# Patient Record
Sex: Female | Born: 1960 | Race: Black or African American | Hispanic: No | Smoking: Never smoker
Health system: Southern US, Community
[De-identification: ages and names within clinical notes are randomized; demographics above are authoritative.]

## PROBLEM LIST (undated history)

## (undated) DIAGNOSIS — G479 Sleep disorder, unspecified: Secondary | ICD-10-CM

## (undated) DIAGNOSIS — R002 Palpitations: Secondary | ICD-10-CM

## (undated) HISTORY — PX: PARTIAL HYSTERECTOMY: SHX80

## (undated) HISTORY — DX: Palpitations: R00.2

## (undated) HISTORY — DX: Sleep disorder, unspecified: G47.9

---

## 1999-07-13 HISTORY — PX: TUBAL LIGATION: SHX77

## 2003-02-05 ENCOUNTER — Inpatient Hospital Stay (HOSPITAL_COMMUNITY): Admission: RE | Admit: 2003-02-05 | Discharge: 2003-02-07 | Payer: Self-pay | Admitting: Obstetrics & Gynecology

## 2007-06-23 ENCOUNTER — Ambulatory Visit (HOSPITAL_COMMUNITY): Admission: RE | Admit: 2007-06-23 | Discharge: 2007-06-23 | Payer: Self-pay | Admitting: Obstetrics & Gynecology

## 2009-01-10 IMAGING — US US PELVIS COMPLETE MODIFY
1 series · 14 of 19 positions shown · non-contrast
Comparison: none

CLINICAL DATA: Left lower quadrant pain and pelvic pain.
 TRANSABDOMINAL AND TRANSVAGINAL PELVIC ULTRASOUND ? 06/23/07:
TECHNIQUE: Both transabdominal and transvaginal ultrasound examinations of the pelvis were performed including evaluation of the uterus, ovaries, adnexal regions, and pelvic cul-de-sac.

[Series 1: unknown · 0.32mm/px · 14 of 19 slices shown]
[im 1/19]
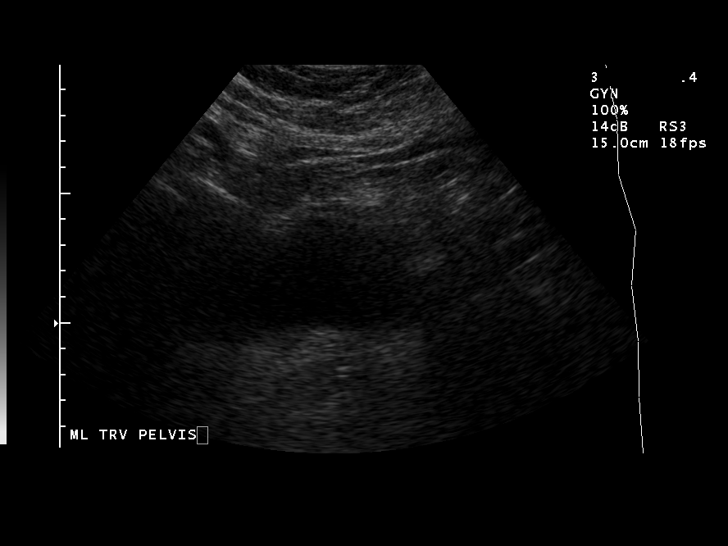
[im 3/19]
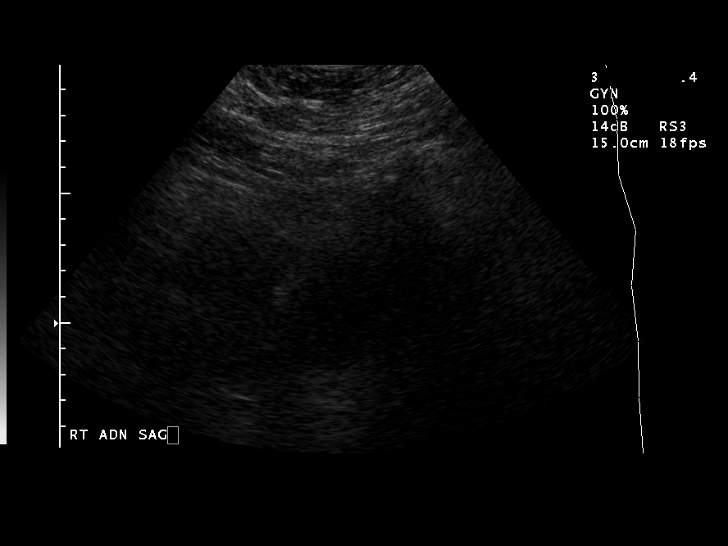
[im 4/19]
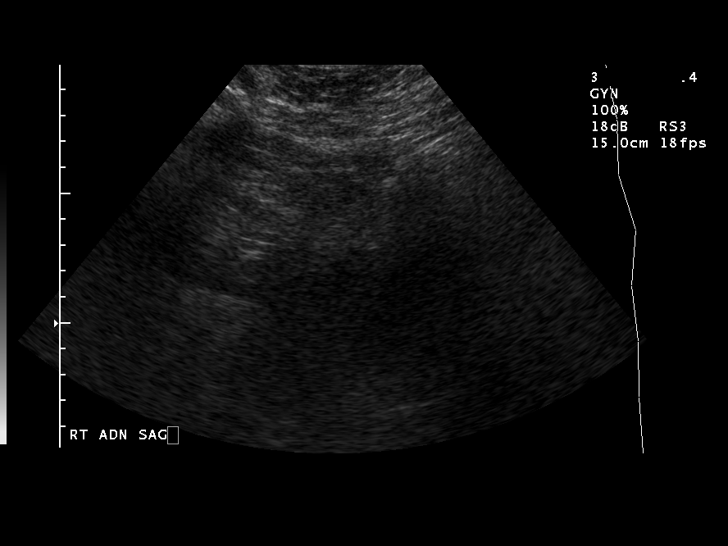
[im 5/19]
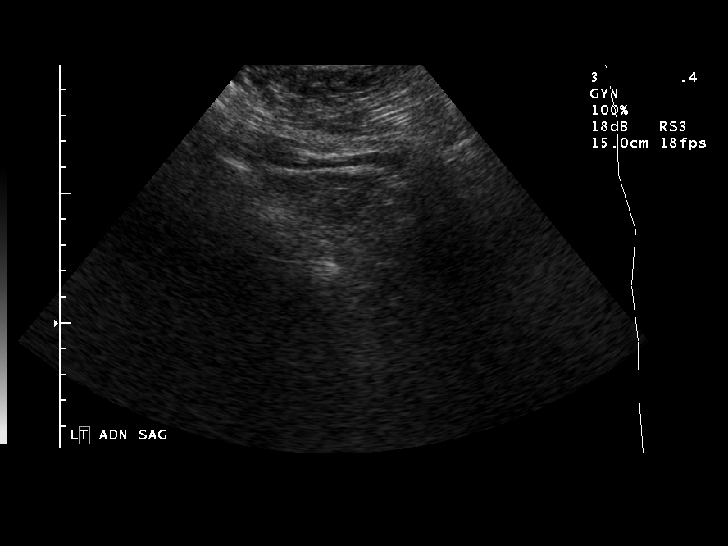
[im 7/19]
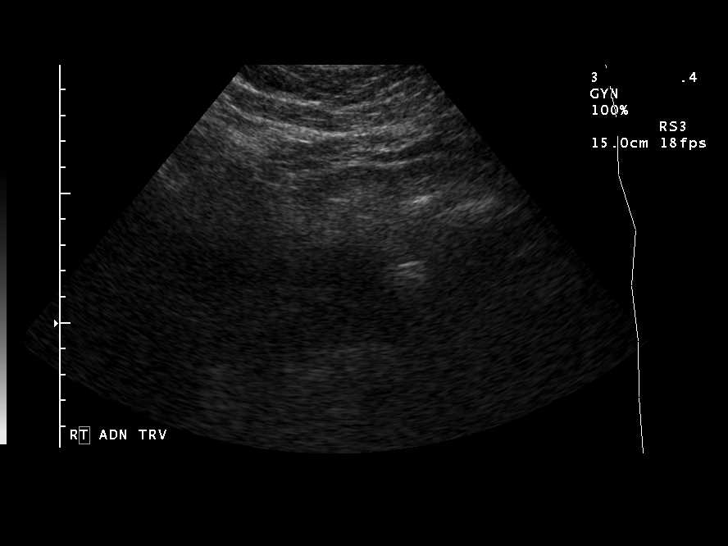
[im 8/19]
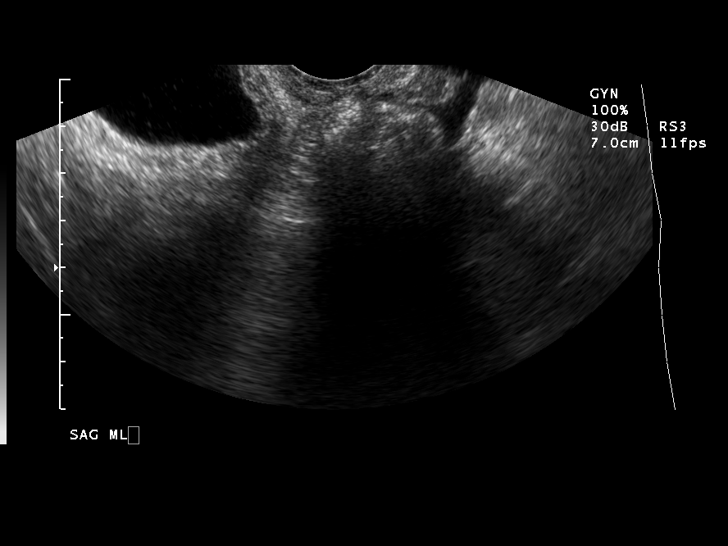
[im 9/19]
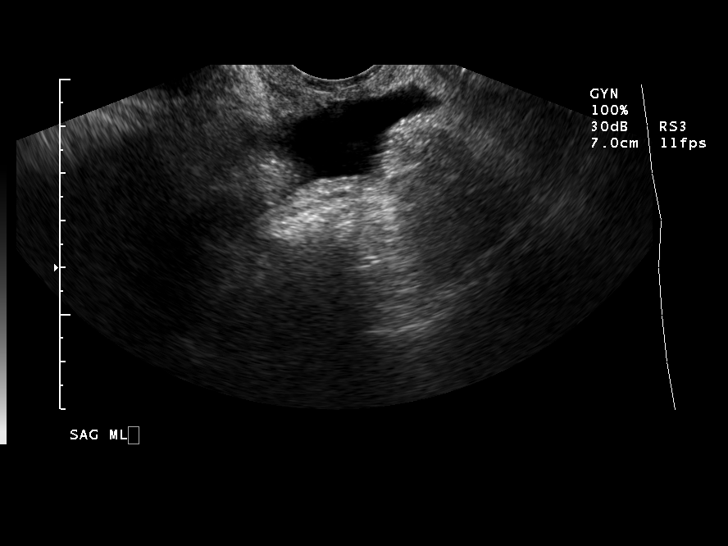
[im 11/19]
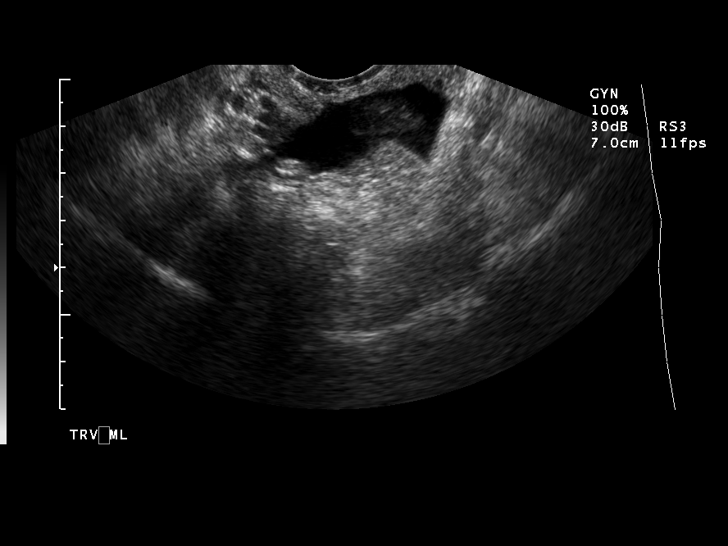
[im 12/19]
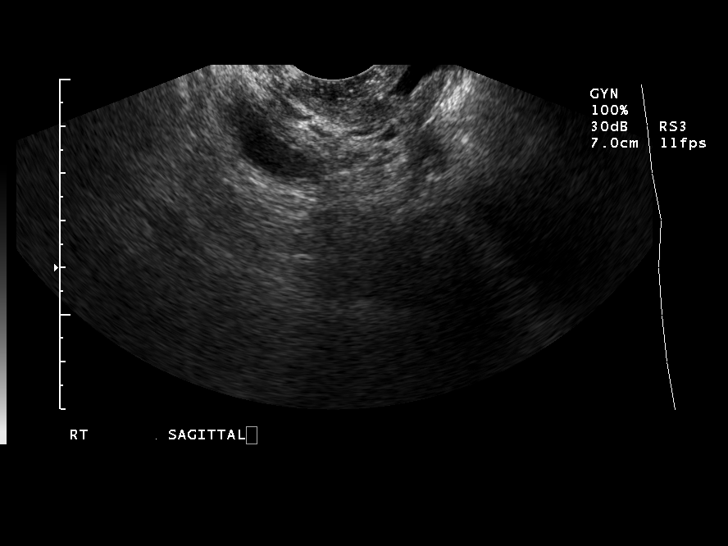
[im 13/19]
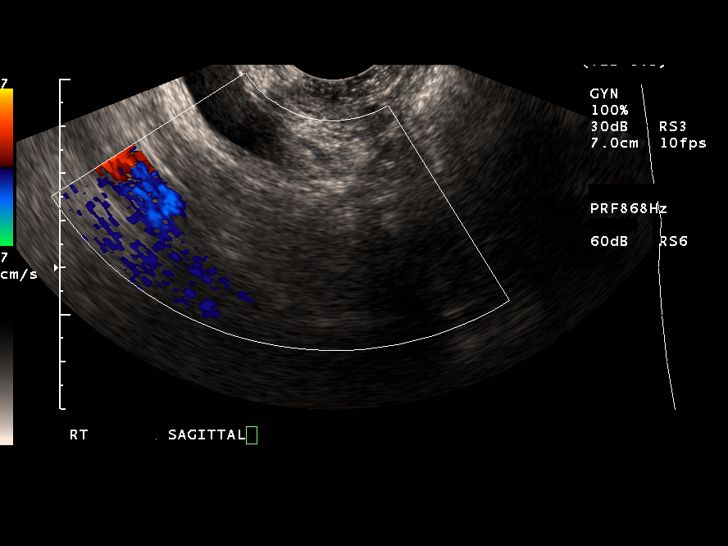
[im 15/19]
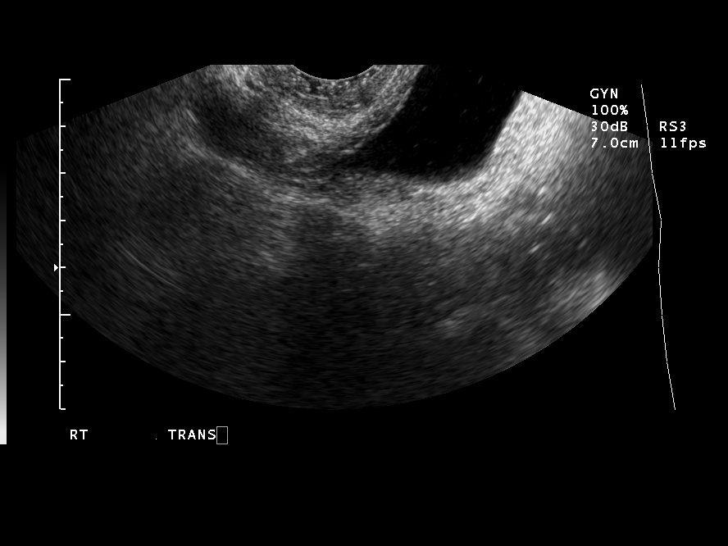
[im 16/19]
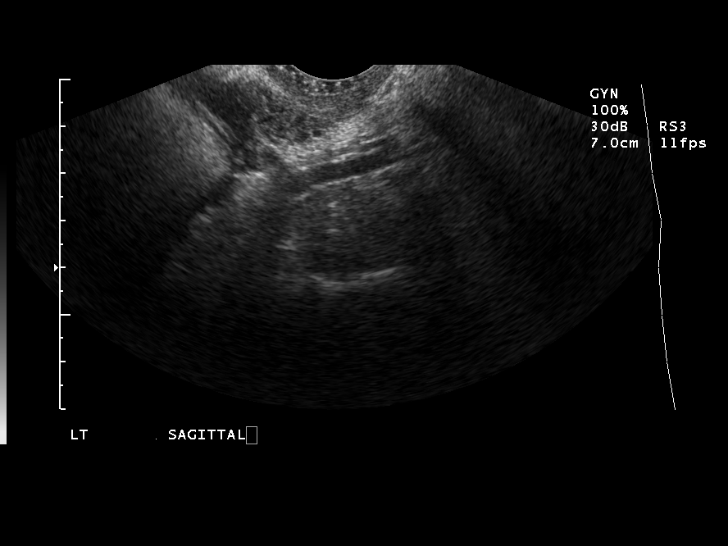
[im 17/19]
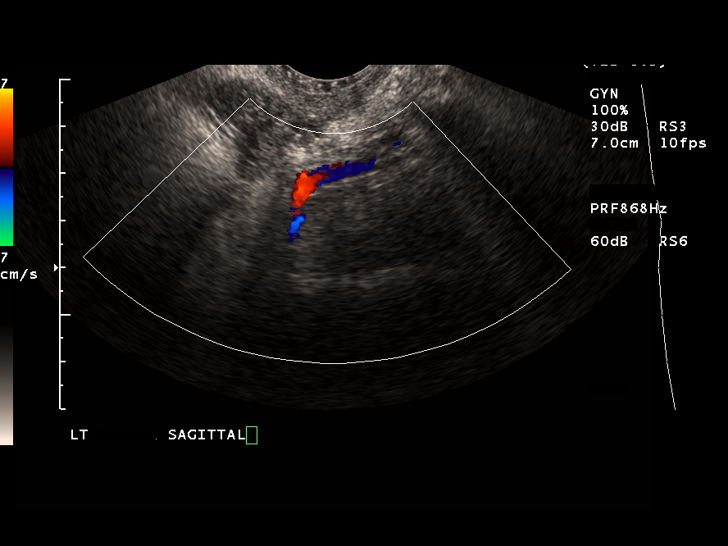
[im 19/19]
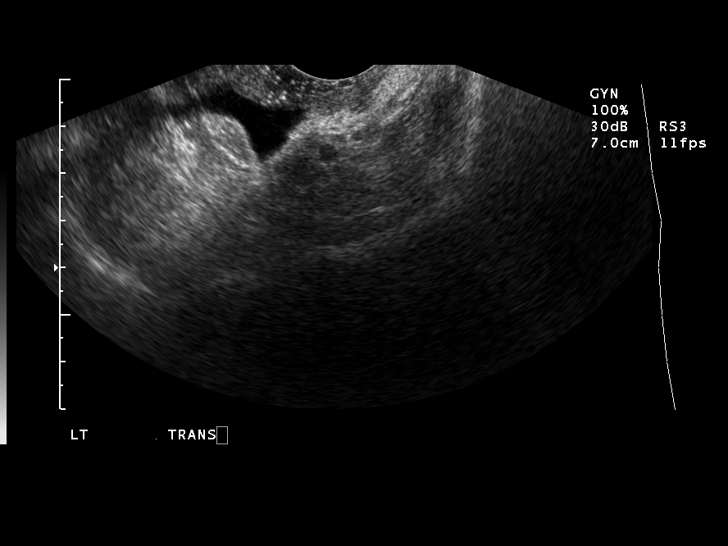

[14 of 19 positions shown; findings below may reference images not displayed]

FINDINGS: The patient has undergone prior hysterectomy.  Neither ovary is visualized due to body habitus.  No fluid is seen within the pelvis.
IMPRESSION: Prior hysterectomy.  No pelvic mass.  Neither ovary is visualized.

## 2009-05-08 ENCOUNTER — Emergency Department (HOSPITAL_COMMUNITY): Admission: EM | Admit: 2009-05-08 | Discharge: 2009-05-08 | Payer: Self-pay | Admitting: Emergency Medicine

## 2009-05-21 ENCOUNTER — Ambulatory Visit: Payer: Self-pay | Admitting: Internal Medicine

## 2009-05-21 DIAGNOSIS — R072 Precordial pain: Secondary | ICD-10-CM | POA: Insufficient documentation

## 2009-05-21 DIAGNOSIS — Z91013 Allergy to seafood: Secondary | ICD-10-CM

## 2009-05-21 DIAGNOSIS — R002 Palpitations: Secondary | ICD-10-CM | POA: Insufficient documentation

## 2009-05-21 DIAGNOSIS — I10 Essential (primary) hypertension: Secondary | ICD-10-CM

## 2009-05-30 ENCOUNTER — Telehealth: Payer: Self-pay | Admitting: Internal Medicine

## 2009-06-11 ENCOUNTER — Telehealth (INDEPENDENT_AMBULATORY_CARE_PROVIDER_SITE_OTHER): Payer: Self-pay | Admitting: *Deleted

## 2009-06-12 ENCOUNTER — Ambulatory Visit: Payer: Self-pay

## 2009-06-12 ENCOUNTER — Encounter: Payer: Self-pay | Admitting: Internal Medicine

## 2009-06-12 ENCOUNTER — Ambulatory Visit: Payer: Self-pay | Admitting: Internal Medicine

## 2009-06-17 ENCOUNTER — Ambulatory Visit: Payer: Self-pay

## 2009-06-17 ENCOUNTER — Encounter (HOSPITAL_COMMUNITY): Admission: RE | Admit: 2009-06-17 | Discharge: 2009-07-10 | Payer: Self-pay | Admitting: Internal Medicine

## 2009-06-26 ENCOUNTER — Encounter: Payer: Self-pay | Admitting: Internal Medicine

## 2009-06-27 ENCOUNTER — Ambulatory Visit: Payer: Self-pay | Admitting: Internal Medicine

## 2009-11-17 ENCOUNTER — Ambulatory Visit (HOSPITAL_COMMUNITY): Admission: RE | Admit: 2009-11-17 | Discharge: 2009-11-17 | Payer: Self-pay | Admitting: Obstetrics & Gynecology

## 2010-10-15 LAB — BASIC METABOLIC PANEL
BUN: 10 mg/dL (ref 6–23)
Chloride: 107 mEq/L (ref 96–112)
Glucose, Bld: 107 mg/dL — ABNORMAL HIGH (ref 70–99)
Potassium: 3.9 mEq/L (ref 3.5–5.1)

## 2010-10-15 LAB — CBC
Platelets: 360 10*3/uL (ref 150–400)
RBC: 3.92 MIL/uL (ref 3.87–5.11)
RDW: 13.5 % (ref 11.5–15.5)

## 2010-11-27 NOTE — H&P (Signed)
   NAME:  Diane Kline, Diane Kline                    ACCOUNT NO.:  [id number]   MEDICAL RECORD NO.:  [id number]                   PATIENT TYPE:  AMB   LOCATION:  DAY                                  FACILITY:  [facility]   PHYSICIAN:  Lazaro Arms, M.D.                DATE OF BIRTH:  09/01/1960   DATE OF ADMISSION:  DATE OF DISCHARGE:                                HISTORY & PHYSICAL   HISTORY OF PRESENT ILLNESS:  [name] is a 50 year old African-American  female, gravida 2, para 2, who is status post C-section in the past, who has  been known to have fibroid uterus since that time.  She has been seen  multiple times regarding her fibroids and they have steadily grown.  Additionally she has become more symptomatic with increasing period pain and  increasing heaviness of her periods, increasing clots, and also painful  intercourse.  As a result she has decided to go from conservative management  to definitive management with a TAH.   PAST MEDICAL HISTORY:  Otherwise negative.   PAST SURGICAL HISTORY:  She had a tubal ligation in 2001 at the time of her  C-section.  She had Norplant in and back out again.   PAST OB HISTORY:  As above.   FAMILY HISTORY:  Hypertension, diabetes, and cancer.   PHYSICAL EXAMINATION:  VITAL SIGNS:  Her weight is 236 pounds, blood  pressure is 120/80.  HEENT:  Unremarkable.  NECK:  Thyroid is normal.  LUNGS:  Clear.  HEART:  Regular rate and rhythm without regurge or gallop.  BREASTS:  Deferred.  ABDOMEN:  Benign.  No hepatosplenomegaly or masses.  PELVIC:  Reveals enlarged uterus, 14-16 weeks' size.  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  My impression is to represent degeneration of  myomas from rapid growth.  The adnexa is negative.  EXTREMITIES:  No edema.  NEUROLOGIC:  Grossly intact.   IMPRESSION:  1. Enlarged fibroid uterus.  2. Increasing problems with pain and menometrorrhagia.  3. Dyspareunia.    PLAN:  The patient is admitted  for abdominal hysterectomy.  She understands  the risks, benefits, indications, and alternatives.  Will proceed.  She was  given handouts regarding hysterectomy and fibroids.                                               Lazaro Arms, M.D.    Diane Kline  D:  02/04/2003  T:  02/04/2003  Job:  [id number]

## 2010-11-27 NOTE — Discharge Summary (Signed)
   NAME:  Diane Kline, Diane Kline                    ACCOUNT NO.:  [id number]   MEDICAL RECORD NO.:  [id number]                   PATIENT TYPE:  INP   LOCATION:  A425                                 FACILITY:  [facility]   PHYSICIAN:  Lazaro Arms, M.D.                DATE OF BIRTH:  January 17, 1961   DATE OF ADMISSION:  02/05/2003  DATE OF DISCHARGE:  02/07/2003                                 DISCHARGE SUMMARY   DISCHARGE DIAGNOSES:  1. Status post abdominal hysterectomy.  2. Unremarkable postoperative course.   PROCEDURE:  Abdominal hysterectomy.   HISTORY OF PRESENT ILLNESS:  Please refer to the transcribed history and  physical and the operative report for details of the admission to the  hospital.   HOSPITAL COURSE:  The patient was admitted after her abdominal hysterectomy,  which went without difficulty.  Her postoperative course was quite  unremarkable.  She tolerated clear liquids and a regular diet.  She voided  without symptoms.  She was ambulatory.  Her hemoglobin and hematocrit on  postoperative day #1 were 11.2 and 32.4.  She tolerated transition from  intravenous pain medication to oral pain medicine and voided without  difficulty.  She was afebrile with stable vital signs.  She was discharged  to home on the morning of postoperative day #2 in good, stable condition.   FOLLOW UP:  The patient is to follow up in the office the following week to  have her staples removed and Steri-Strips placed. She was given precautions  for return prior to that time.   DISCHARGE MEDICATIONS:  Patient was given Tylox and Motrin as discharge  prescriptions.                                                Lazaro Arms, M.D.    [name]  D:  03/26/2003  T:  03/26/2003  Job:  [id number]

## 2010-11-27 NOTE — Op Note (Signed)
NAME:  Diane Kline, Diane Kline                    ACCOUNT NO.:  [id number]   MEDICAL RECORD NO.:  [id number]                   PATIENT TYPE:  AMB   LOCATION:  DAY                                  FACILITY:  [facility]   PHYSICIAN:  Lazaro Arms, M.D.                DATE OF BIRTH:  12/21/1960   DATE OF PROCEDURE:  02/05/2003  DATE OF DISCHARGE:                                 OPERATIVE REPORT   PREOPERATIVE DIAGNOSES:  1. Enlarged fibroid uterus, 14 to 16-week size.  2. Increasing dysmenorrhea.  3. Menometrorrhagia.   POSTOPERATIVE DIAGNOSES:  1. Enlarged fibroid uterus, 14 to 16-week size.  2. Increasing dysmenorrhea.  3. Menometrorrhagia.   PROCEDURE:  Abdominal hysterectomy.   SURGEON:  Lazaro Arms, M.D.   ANESTHESIA:  General endotracheal.   FINDINGS:  The patient had an enlarged fibroid uterus.  Multiple myomas were  identified, some subserosal and some pedunculated.  The ovaries were  otherwise normal.  There were no other intraperitoneal abnormalities.   DESCRIPTION OF PROCEDURE:  The patient was taken to the operating room and  placed in the supine position where she underwent general endotracheal  anesthesia.  The vagina was then prepped.  A Foley catheter was placed.  She  was then prepped and draped in the usual sterile fashion.   A Pfannenstiel skin incision was made and carried down sharply to the rectus  fascia.  The peritoneal cavity was entered in the usual fashion.  A Balfour  self-retaining retractor was placed, and the upper abdomen was packed away.  A bladder blade was placed.  The uterine cornu were grasped.  The left round  ligament was suture ligated and cut.  The vesicouterine serosal flap on the  left was created.  The utero-ovarian ligament on the left was clamped, cut,  and double suture ligated.  The right round ligament was suture ligated and  cut.  The utero-ovarian ligament on the right was clamped, cut, and double  suture ligated.  The  vesicouterine serosal flap on the right was created.  The bladder was pushed off of the lower uterine segment.  The uterine  vessels were clamped, cut, and suture ligated bilaterally.  Serial pedicles  were taken down the cervix through the cardinal ligament, each pedicle being  clamped, cut, and transfixion suture ligated.  Serial pedicles were taken  encountered at the end of the cervix and crossclamped the vagina, and the  specimen was removed.  Vaginal angle sutures were placed.  The vagina was  closed with interrupted figure-of-eight sutures.  The pelvis was irrigated  vigorously with warm normal saline, and all pedicles were found to be  hemostatic.  Intercede was placed over the vaginal cuff to prevent  postoperative adhesions and prevent the small bowel and ovaries from  sticking to the top of the cuff.  The packs were removed.  The self-  retaining retractor was removed.  The muscles and  peritoneum were closed  with interrupted suture loosely.  The fascia was closed using 0 Vicryl  running.  The subcutaneous tissues were made hemostatic and irrigated.  The  skin was closed using skin staples.   The patient tolerated the procedure well.  She experienced 200 cc of blood  loss and was taken to the recovery room in good and stable condition.  All  counts were correct x3.                                               Lazaro Arms, M.D.    [name]  D:  02/05/2003  T:  02/05/2003  Job:  [id number]

## 2014-05-14 ENCOUNTER — Ambulatory Visit (INDEPENDENT_AMBULATORY_CARE_PROVIDER_SITE_OTHER): Payer: Medicaid Other | Admitting: Orthopedic Surgery

## 2014-05-14 ENCOUNTER — Encounter: Payer: Self-pay | Admitting: Orthopedic Surgery

## 2014-05-14 VITALS — BP 122/78 | Ht 64.0 in | Wt 261.0 lb

## 2014-05-14 DIAGNOSIS — S6991XA Unspecified injury of right wrist, hand and finger(s), initial encounter: Secondary | ICD-10-CM

## 2014-05-14 DIAGNOSIS — S6990XA Unspecified injury of unspecified wrist, hand and finger(s), initial encounter: Secondary | ICD-10-CM | POA: Insufficient documentation

## 2014-05-14 NOTE — Progress Notes (Signed)
Chief Complaint  Patient presents with  . Hand Injury    Right thumb fracture, DOI 04/05/14   Patient ID: Diane Kline, female   DOB: 04-08-1961, 53 y.o.   MRN: [id number]  Chief Complaint  Patient presents with  . Hand Injury    Right thumb fracture, DOI 04/05/14    HPI Diane Kline is a 53 y.o. female. HPI  The patient resents with a injury to her right thumb crushed in a car door back in September on the 25th she was treated with splinting and follow-up here for evaluation and treatment  She complains of some dull pain and mild nailbed deformity. She was treated with nabumetone 750 as well. She has full function of the thumb at this point with a slight deformity of the nail but an intact nail with some subungual hematoma.  Review of systems she reports sinus problems and night sweats everything else was negative. She has no allergies she has a medical history of no problems she did have a cesarean section and a partial hysterectomy she takes vitamins  Her thumb has full flexion and extension with no weakness tendon dysfunction or motion loss. There is no instability at the DIP joint or the IP joint. No strength deficits in the flexor extensor tendon skin is intact the nail has some subungual hematoma has good capillary refill in the finger normal sensation    No past medical history on file.  No past surgical history on file.  No family history on file.  Social History History  Substance Use Topics  . Smoking status: Unknown If Ever Smoked  . Smokeless tobacco: Not on file  . Alcohol Use: 0.0 oz/week    0 Not specified per week    Allergies not on file  No current outpatient prescriptions on file.   No current facility-administered medications for this visit.    Review of Systems Review of Systems  Blood pressure 122/78, height 5\' 4"  (1.626 m), weight 261 lb (118.389 kg).  Physical Exam Physical Exam  Data Reviewed X-ray in addition shows a  slight avulsion fracture near the IP joint, nothing displaced  Assessment    Crush injury right thumb     Plan    Activity as tolerated        Fuller CanadaStanley Harrison 05/14/2014, 11:15 AM

## 2014-07-25 ENCOUNTER — Telehealth: Payer: Self-pay | Admitting: Cardiology

## 2014-07-25 NOTE — Telephone Encounter (Signed)
[facility]Dr Lilly CoveNimish Gosrani) for appointment on 07/26/14 with Dr Antoine PocheHochrein.  [facility] Dr Hochrein's schedule on 07/26/14. lp

## 2014-07-26 ENCOUNTER — Encounter: Payer: Self-pay | Admitting: Cardiology

## 2014-07-26 ENCOUNTER — Ambulatory Visit (INDEPENDENT_AMBULATORY_CARE_PROVIDER_SITE_OTHER): Payer: Medicaid Other | Admitting: Cardiology

## 2014-07-26 VITALS — BP 114/72 | Ht 64.0 in | Wt 266.6 lb

## 2014-07-26 DIAGNOSIS — R002 Palpitations: Secondary | ICD-10-CM

## 2014-07-26 DIAGNOSIS — I10 Essential (primary) hypertension: Secondary | ICD-10-CM

## 2014-07-26 DIAGNOSIS — R0683 Snoring: Secondary | ICD-10-CM

## 2014-07-26 DIAGNOSIS — G4730 Sleep apnea, unspecified: Secondary | ICD-10-CM

## 2014-07-26 NOTE — Progress Notes (Signed)
   HPI The patient presents for evaluation of palpitations and fatigue she has a history of palpitations and she was seen years ago by Dr. Graciela HusbandsKlein.  However, these symptoms are worse than they had been.  She feels palpitations every day.  She feels her heart racing.  This seems to be more with stress.  She has it more at the end of the month when the bills are due.  She describes a rapid rate.  She does not have syncope or presyncope.  She has severe fatigue.  The patient denies any new symptoms such as chest discomfort, neck or arm discomfort. There has been no new shortness of breath, PND or orthopnea. There have been no reported palpitations, presyncope or syncope.  She is very stressed raising a 52 year old son as a single parent.   Allergies  Allergen Reactions  . Shellfish Allergy     No current outpatient prescriptions on file.   No current facility-administered medications for this visit.    Past Medical History  Diagnosis Date  . Sleep disorder     breathing  . Hypertension   . Palpitations     Past Surgical History  Procedure Laterality Date  . Tubal ligation  2001  . Cesarean section    . Partial hysterectomy      No family history on file.  History   Social History  . Marital Status: Divorced    Spouse Name: N/A    Number of Children: N/A  . Years of Education: N/A   Occupational History  . Not on file.   Social History Main Topics  . Smoking status: Unknown If Ever Smoked  . Smokeless tobacco: Not on file  . Alcohol Use: 0.0 oz/week    0 Not specified per week  . Drug Use: No  . Sexual Activity: Not on file   Other Topics Concern  . Not on file   Social History Narrative    ROS:  As stated in the HPI and negative for all other systems.   PHYSICAL EXAM BP 114/72 mmHg  Ht 5\' 4"  (1.626 m)  Wt 266 lb 9.6 oz (120.929 kg)  BMI 45.74 kg/m2  GENERAL:  Well appearing HEENT:  Pupils equal round and reactive, fundi not visualized, oral mucosa  unremarkable NECK:  No jugular venous distention, waveform within normal limits, carotid upstroke brisk and symmetric, no bruits, no thyromegaly LYMPHATICS:  No cervical, inguinal adenopathy LUNGS:  Clear to auscultation bilaterally BACK:  No CVA tenderness CHEST:  Unremarkable HEART:  PMI not displaced or sustained,S1 and S2 within normal limits, no S3, no S4, no clicks, no rubs, no murmurs ABD:  Flat, positive bowel sounds normal in frequency in pitch, no bruits, no rebound, no guarding, no midline pulsatile mass, no hepatomegaly, no splenomegaly EXT:  2 plus pulses throughout, no edema, no cyanosis no clubbing SKIN:  No rashes no nodules NEURO:  Cranial nerves II through XII grossly intact, motor grossly intact throughout PSYCH:  Cognitively intact, oriented to person place and time, tearful   EKG:   NSR, rate 93, axis WNL, intervals WNL, no acute ST T wave changes.   07/26/2014  ASSESSMENT AND PLAN  SNORING/FATIGUE:  I will schedule her for a sleep study.  She reports that she has had routine lab testing which I will assume includes a TSH and CBC.  PALPITATIONS:  I will order a 48 hour Holter.  OVERWEIGHT:  We talked about strategies for this.

## 2014-07-26 NOTE — Patient Instructions (Signed)
Your physician recommends that you schedule a follow-up appointment in: 1 Month  Your physician has recommended that you have a sleep study. This test records several body functions during sleep, including: brain activity, eye movement, oxygen and carbon dioxide blood levels, heart rate and rhythm, breathing rate and rhythm, the flow of air through your mouth and nose, snoring, body muscle movements, and chest and belly movement.  Your physician has recommended that you wear a holter monitor. Holter monitors are medical devices that record the heart's electrical activity. Doctors most often use these monitors to diagnose arrhythmias. Arrhythmias are problems with the speed or rhythm of the heartbeat. The monitor is a small, portable device. You can wear one while you do your normal daily activities. This is usually used to diagnose what is causing palpitations/syncope (passing out).

## 2014-08-14 ENCOUNTER — Ambulatory Visit (HOSPITAL_BASED_OUTPATIENT_CLINIC_OR_DEPARTMENT_OTHER): Payer: Medicaid Other | Attending: Cardiology | Admitting: Radiology

## 2014-08-14 VITALS — Ht 64.0 in | Wt 262.0 lb

## 2014-08-14 DIAGNOSIS — G4730 Sleep apnea, unspecified: Secondary | ICD-10-CM | POA: Diagnosis present

## 2014-08-14 DIAGNOSIS — R0683 Snoring: Secondary | ICD-10-CM | POA: Insufficient documentation

## 2014-08-14 DIAGNOSIS — R002 Palpitations: Secondary | ICD-10-CM

## 2014-08-18 NOTE — Sleep Study (Signed)
   NAME: Diane Kline DATE OF BIRTH:  [birth year] MEDICAL RECORD NUMBER [id number]  LOCATION: [facility]  PHYSICIAN: Tieler Cournoyer A  DATE OF STUDY: 08/14/2014  SLEEP STUDY TYPE: Nocturnal Polysomnogram               REFERRING PHYSICIAN: Rollene RotundaHochrein, James, MD  INDICATION FOR STUDY:  Diane Kline is a 54 year old female who has a history of morbid obesity, hypertension, palpitations, who is referred for evaluation of sleep apnea.  She has a history of significant snoring, nonrestorative sleep, and daytime sleepiness.  EPWORTH SLEEPINESS SCORE:  13 which is elevated and compatible with excessive daytime sleepiness. HEIGHT: 5\' 4"  (162.6 cm)  WEIGHT: 262 lb (118.842 kg)    Body mass index is 44.95 kg/(m^2).  NECK SIZE: 16 in.  MEDICATIONS:  She is not on any current medications.  SLEEP ARCHITECTURE:  The patient slept for 368 minutes out of a sleep period of time of 383.5 minutes.  Sleep efficiency was excellent at 93.3%.  Latency to sleep onset was normal at 10.5 minutes.  Latency to REM sleep was 105.5 minutes.  The patient slept for 7.5 minutes in stage I (2%), 292.5 minutes in stage II (79.5%), and 68 minutes in rem sleep (18.5%).  She spent 116.5 minutes (31.7%) in supine sleep, of which 0.5 minutes (5.0%) was supine REM sleep.  There was evidence for mild-moderate snoring.  The arousal index was increased at 19.4.  RESPIRATORY DATA:  During the sleep period of time, the patient had 100 obstructive apneas, 0 central apneas, 1 mixed apnea, and 141 hypopneas.  The apnea hypopnea index  (AHI) was 39.5/hr and the respiratory disturbance index (RDI) was 42.2/hr. The AHI in REM sleep was 74/hr.  This places the patient in the category of severe obstructive sleep apnea.  Severe sleep apnea was demonstrated both in supine as well as in nonsupine sleep.  OXYGEN DATA:  The baseline oxygen saturation was 98%.  The lowest oxygen saturation during non-REM sleep  was 72% and during REM sleep was 68%.  CARDIAC DATA:  The baseline heart rate was 91 bpm which was sinus rhythm.  There were rare to occasional PVCs and PACs.  MOVEMENT/PARASOMNIA:  There were 0 periodic limb movements.  IMPRESSION/ RECOMMENDATION:   Severe obstructive sleep apnea/hypoxia syndrome.  Events were more severe in REM sleep.  Respiratory events with frequent oxygen desaturation to a nadir of 72% with non-REM sleep and 68% with REM sleep. Mild to moderate snoring. Abnormal sleep architecture with reduction in slow wave sleep. No evidence for nocturnal myoclonus. The arousal index was abnormal.  In this patient with severe obstructive sleep apnea, recommend expeditious CPAP titration. Effort should be made to optimize nasal and oral pharyngeal patency. The patient should be counseled in both good sleep hygiene as well as weight loss, particularly with body mass index of 45.    Lennette BihariKELLY,Nephtali Docken A Diplomate, American Board of Sleep Medicine  ELECTRONICALLY SIGNED ON:  08/18/2014, 12:07 PM [facility] PH: [phone]   FX: [phone] ACCREDITED BY THE AMERICAN ACADEMY OF SLEEP MEDICINE

## 2014-08-18 NOTE — Addendum Note (Signed)
Addended by: Nicki GuadalajaraKELLY, Alvena Kiernan A on: 08/18/2014 12:26 PM   Modules accepted: Level of Service

## 2014-08-23 ENCOUNTER — Telehealth: Payer: Self-pay | Admitting: *Deleted

## 2014-08-23 ENCOUNTER — Telehealth: Payer: Self-pay | Admitting: Cardiology

## 2014-08-23 DIAGNOSIS — G4733 Obstructive sleep apnea (adult) (pediatric): Secondary | ICD-10-CM

## 2014-08-23 NOTE — Telephone Encounter (Signed)
Pt is returning Wanda's call in reference to her sleep apnea test last week.  thanks

## 2014-08-23 NOTE — Telephone Encounter (Signed)
Returned a call to patient to inform her of sleep study results and recommendations. CPAP titration study ordered.

## 2014-08-23 NOTE — Telephone Encounter (Signed)
Left message to return a call.( sleep study results) 

## 2014-08-29 ENCOUNTER — Ambulatory Visit (HOSPITAL_BASED_OUTPATIENT_CLINIC_OR_DEPARTMENT_OTHER): Payer: Medicaid Other | Attending: Cardiovascular Disease | Admitting: Radiology

## 2014-08-29 VITALS — Ht 64.0 in | Wt 264.0 lb

## 2014-08-29 DIAGNOSIS — G4733 Obstructive sleep apnea (adult) (pediatric): Secondary | ICD-10-CM | POA: Diagnosis present

## 2014-08-30 ENCOUNTER — Ambulatory Visit (INDEPENDENT_AMBULATORY_CARE_PROVIDER_SITE_OTHER): Payer: Medicaid Other | Admitting: Cardiology

## 2014-08-30 ENCOUNTER — Encounter: Payer: Self-pay | Admitting: Cardiology

## 2014-08-30 VITALS — BP 126/85 | HR 82 | Ht 64.0 in | Wt 266.2 lb

## 2014-08-30 DIAGNOSIS — R002 Palpitations: Secondary | ICD-10-CM

## 2014-08-30 NOTE — Progress Notes (Signed)
   HPI The patient presents for follow up of palpitations and fatigue.  She had severe sleep apnea on a study that she had earlier this month. Last night she came back for part 2 were the mask and this morning she feels much better. I did have her wear an event monitor. This showed sinus rhythm with sinus arrhythmia. There was rare ventricular ectopy and no sustained dysrhythmias. I reviewed this with her today. She does have palpitations and some discomfort associated with this. However, this always happens with anxiety she has a lot of stress. It happens after she has an anxious dream. She is not describing classic substernal chest pressure, neck or arm discomfort..  She has severe fatigue.  here has been no new shortness of breath, PND or orthopnea. There have been no reported palpitations, presyncope or syncope.   Allergies  Allergen Reactions  . Shellfish Allergy     No current outpatient prescriptions on file.   No current facility-administered medications for this visit.    Past Medical History  Diagnosis Date  . Sleep disorder     She thinks she has apnea but no testing  . Palpitations     Past Surgical History  Procedure Laterality Date  . Tubal ligation  2001  . Cesarean section    . Partial hysterectomy      ROS:  As stated in the HPI and negative for all other systems.   PHYSICAL EXAM BP 126/85 mmHg  Pulse 82  Ht 5\' 4"  (1.626 m)  Wt 266 lb 3.2 oz (120.748 kg)  BMI 45.67 kg/m2  GENERAL:  Well appearing HEENT:  Pupils equal round and reactive, fundi not visualized, oral mucosa unremarkable NECK:  No jugular venous distention, waveform within normal limits, carotid upstroke brisk and symmetric, no bruits, no thyromegaly LYMPHATICS:  No cervical, inguinal adenopathy LUNGS:  Clear to auscultation bilaterally BACK:  No CVA tenderness CHEST:  Unremarkable HEART:  PMI not displaced or sustained,S1 and S2 within normal limits, no S3, no S4, no clicks, no rubs, no  murmurs ABD:  Flat, positive bowel sounds normal in frequency in pitch, no bruits, no rebound, no guarding, no midline pulsatile mass, no hepatomegaly, no splenomegaly EXT:  2 plus pulses throughout, no edema, no cyanosis no clubbing NEURO:  Cranial nerves II through XII grossly intact, motor grossly intact throughout PSYCH:  Cognitively intact, oriented to person place and time.     EKG:   NSR, rate 82, axis WNL, intervals WNL, no acute ST T wave changes.   08/30/2014  ASSESSMENT AND PLAN  SNORING/FATIGUE:  She felt much better last night wearing the mask. I discussed this with Dr. Tresa EndoKelly and we will expedite follow-up in a prescription for this. Further plans based on response to this.  PALPITATIONS:  She had no significant dysrhythmias on her monitor. I suspect this is related to her severe anxiety. No change in therapy is indicated.  OVERWEIGHT:  We talked about strategies for this.

## 2014-08-30 NOTE — Patient Instructions (Signed)
Your physician recommends that you schedule a follow-up appointment with Dr Tresa EndoKelly when he has read your sleep study you will get a call

## 2014-09-05 ENCOUNTER — Telehealth: Payer: Self-pay | Admitting: Cardiovascular Disease

## 2014-09-05 NOTE — Telephone Encounter (Signed)
Pt would like her sleep study results from 08-29-14 please.

## 2014-09-06 NOTE — Telephone Encounter (Signed)
Patient has been told on multiple occasions that once her study has been read the referral to the medical equipment company will be done. She will be contacted once her benefits has been verified. Message forwarded to Dr. Tresa EndoKelly for review. The second night study hasn't formally been read.

## 2014-09-07 NOTE — Addendum Note (Signed)
Addended by: Nicki GuadalajaraKELLY, THOMAS A on: 09/07/2014 05:02 PM   Modules accepted: Level of Service

## 2014-09-07 NOTE — Telephone Encounter (Signed)
The CPAP titration study was read by me 2/27

## 2014-09-07 NOTE — Sleep Study (Signed)
   NAME: [name] DATE OF BIRTH:  25-Jan-1961 MEDICAL RECORD NUMBER [id number]  LOCATION: [facility]  PHYSICIAN: Brixon Zhen A  DATE OF STUDY: 08/29/2014  SLEEP STUDY TYPE: Positive Airway Pressure Titration               REFERRING PHYSICIAN: Lennette BihariKelly, Enolia Koepke A, MD, Angelina SheriffJake Hochrein, MD  INDICATION FOR STUDY:  Ms. Diane Kline is a 54 year old female who has a history of morbid obesity, hypertension, and palpitations.  On diagnostic polysomnogram .  She was found to have severe obstructive sleep apnea with an AHI of 39.5 per hour overall and during REM sleep 74 per hour.  She is referred for CPAP titration trial.  EPWORTH SLEEPINESS SCORE:  12, which is compatible with excessive daytime sleepiness. HEIGHT: 5\' 4"  (162.6 cm)  WEIGHT: 264 lb (119.75 kg)    Body mass index is 45.29 kg/(m^2).  NECK SIZE: 16 in.  MEDICATIONS:  No current medications.   SLEEP ARCHITECTURE:  The patient slept for 369 minutes out of a sleep period of time of 384.5 minutes, giving a sleep efficiency at 95.1%.  Sleep latency was 3.5 minutes.  She had normal latency to REM sleep at 81 minutes.  She slept 6.5 minutes in stage I (1.8%), 241.5 minutes in stage II (65.4%), 0 minutes in stage III, and 121 minutes (32.8%) in REM sleep.  She was able to sleep supine for 190 minutes (51.5% of which 78.5 minutes (21.3%) was in supine REM sleep.  There were a total of 12 arousals with an index of 2.0.  RESPIRATORY DATA:  CPAP was initiated at 5 cm water pressure and was titrated up to 9 cm water pressure.  AHI was 0 at 9 cm.  She was able to achieve supine and REM supine sleep was able to sleep for 35.5 minutes at this pressure.  OXYGEN DATA:  Baseline oxygen saturation was 98%.  The lows oxygen saturation with non-REM sleep was 93% and with REM sleep was 87%.  The lowest O2 saturation in REM sleep at 9 cm pressure was 94%.  CARDIAC DATA:  The patient was in sinus rhythm.  The average heart rate  was 84 bpm.  There was a rare isolated PVC.  MOVEMENT/PARASOMNIA:  There were 0 periodic limb movements.  IMPRESSION/ RECOMMENDATION:   The patient had an excellent response to CPAP therapy.  Recommend initial use of CPAP with C-Flex/EPR at 3 and 9 cm water pressure with heated humidifier and heated tubing.  A ResMed Airfit F 10 FFM, medium size was used during the titration.  Recommend a download load in 30 days and sleep clinic evaluation.    Lennette BihariKELLY,Daryel Kenneth A Diplomate, American Board of Sleep Medicine  ELECTRONICALLY SIGNED ON:  09/07/2014, 4:46 PM [facility] PH: [phone]   FX: [phone] ACCREDITED BY THE AMERICAN ACADEMY OF SLEEP MEDICINE

## 2014-09-09 NOTE — Telephone Encounter (Signed)
Patient referral done to [facility].

## 2014-09-10 ENCOUNTER — Encounter: Payer: Self-pay | Admitting: Cardiology

## 2014-09-12 ENCOUNTER — Encounter: Payer: Self-pay | Admitting: Cardiovascular Disease

## 2014-09-13 ENCOUNTER — Telehealth: Payer: Self-pay | Admitting: *Deleted

## 2014-09-13 NOTE — Telephone Encounter (Signed)
Faxed signed order for medicaid approval form back to [facility].

## 2014-09-15 ENCOUNTER — Encounter (HOSPITAL_BASED_OUTPATIENT_CLINIC_OR_DEPARTMENT_OTHER): Payer: Medicaid Other

## 2014-09-16 ENCOUNTER — Telehealth: Payer: Self-pay | Admitting: Cardiovascular Disease

## 2014-09-16 NOTE — Telephone Encounter (Signed)
Returning your call from last week. °

## 2014-10-04 ENCOUNTER — Encounter (HOSPITAL_BASED_OUTPATIENT_CLINIC_OR_DEPARTMENT_OTHER): Payer: Medicaid Other

## 2014-10-08 ENCOUNTER — Telehealth: Payer: Self-pay | Admitting: Cardiovascular Disease

## 2014-10-08 NOTE — Telephone Encounter (Signed)
Spoke with Jasmine DecemberSharon @ [facility]. [facility]. She will call patient and schedule a time for set up. She will also call Becky @ Dr. Michelle NasutiKnowlton's office to give update.

## 2014-10-08 NOTE — Telephone Encounter (Signed)
Dr. Sudie BaileyKnowlton would like for us to follow up with [facility] on the status of Mrs. Connors's CPAP machine.  He feels this has become a urgent matter.  Please let his office know when this has been addressed and also the outcome.

## 2014-10-18 ENCOUNTER — Encounter: Payer: Self-pay | Admitting: Cardiology

## 2014-10-18 ENCOUNTER — Ambulatory Visit (INDEPENDENT_AMBULATORY_CARE_PROVIDER_SITE_OTHER): Payer: Medicaid Other | Admitting: Cardiology

## 2014-10-18 VITALS — BP 118/72 | HR 85 | Ht 64.0 in | Wt 267.0 lb

## 2014-10-18 DIAGNOSIS — R072 Precordial pain: Secondary | ICD-10-CM

## 2014-10-18 DIAGNOSIS — I10 Essential (primary) hypertension: Secondary | ICD-10-CM

## 2014-10-18 NOTE — Patient Instructions (Addendum)
Your physician recommends that you schedule a follow-up appointment As Needed  Your physician has requested that you have an exercise tolerance test. For further information please visit [email or web address]. Please also follow instruction sheet, as given.  Your physician recommends that you schedule a follow-up appointment in: June with Dr Tresa EndoKelly for sleep study

## 2014-10-18 NOTE — Progress Notes (Signed)
   HPI The patient presents for follow up of palpitations and fatigue.  She had severe sleep apnea on a study that she had earlier this month and is having CPAP initiated.  She just started it and it might a helping a little.    Unfortunately she still continues to have complaints. She does have some left arm numbness when she gets upset. She feels the palpitations that she has described previously. She's not going to school now because she has too much stress she recognizes the anxiety. She's not having any new substernal chest pressure, neck discomfort. She's not having any new shortness of breath, PND or orthopnea. She says she still too fatigued to exercise.  Allergies  Allergen Reactions  . Shellfish Allergy     No current outpatient prescriptions on file.   No current facility-administered medications for this visit.    Past Medical History  Diagnosis Date  . Sleep disorder     She thinks she has apnea but no testing  . Palpitations     Past Surgical History  Procedure Laterality Date  . Tubal ligation  2001  . Cesarean section    . Partial hysterectomy      ROS:  As stated in the HPI and negative for all other systems.   PHYSICAL EXAM BP 118/72 mmHg  Pulse 85  Ht 5\' 4"  (1.626 m)  Wt 267 lb (121.11 kg)  BMI 45.81 kg/m2  GENERAL:  Well appearing NECK:  No jugular venous distention, waveform within normal limits, carotid upstroke brisk and symmetric, no bruits, no thyromegaly LUNGS:  Clear to auscultation bilaterally BACK:  No CVA tenderness CHEST:  Unremarkable HEART:  PMI not displaced or sustained,S1 and S2 within normal limits, no S3, no S4, no clicks, no rubs, no murmurs ABD:  Flat, positive bowel sounds normal in frequency in pitch, no bruits, no rebound, no guarding, no midline pulsatile mass, no hepatomegaly, no splenomegaly EXT:  2 plus pulses throughout, no edema, no cyanosis no clubbing   EKG:  Sinus rhythm, rate 85, axis within normal limits, intervals  within normal limits, no acute ST-T wave changes.  10/18/2014   ASSESSMENT AND PLAN  SNORING/FATIGUE:  She has sleep apnea and I will make sure she has follow-up with Dr. Tresa EndoKelly. She will continue to wear the mask.  PALPITATIONS:  She had no significant dysrhythmias on her monitor. I suspect this is related to her severe anxiety. No change in therapy is indicated.  ARM PAIN:  I have a very low pretest probability that she has obstructive coronary disease. I will bring the patient back for a POET (Plain Old Exercise Test). This will allow me to screen for obstructive coronary disease, risk stratify and very importantly provide a prescription for exercise.  ANXIETY:  I did discuss this with her at length. When she sees Milana ObeyKNOWLTON,STEPHEN D, MD next she will discuss this issue. By that time she should have had several weeks on C Pap to see how many of her symptoms resolved.

## 2014-10-25 ENCOUNTER — Other Ambulatory Visit: Payer: Self-pay | Admitting: Cardiology

## 2014-10-25 DIAGNOSIS — R072 Precordial pain: Secondary | ICD-10-CM

## 2014-10-25 DIAGNOSIS — I10 Essential (primary) hypertension: Secondary | ICD-10-CM

## 2014-11-15 ENCOUNTER — Telehealth (HOSPITAL_COMMUNITY): Payer: Self-pay

## 2014-11-15 NOTE — Telephone Encounter (Signed)
Encounter complete. 

## 2014-11-20 ENCOUNTER — Encounter (HOSPITAL_COMMUNITY): Payer: Medicaid Other

## 2014-11-20 ENCOUNTER — Ambulatory Visit (HOSPITAL_COMMUNITY)
Admission: RE | Admit: 2014-11-20 | Discharge: 2014-11-20 | Disposition: A | Discharge status: Home / Self Care | Payer: Medicaid Other | Source: Ambulatory Visit | Attending: Cardiology | Admitting: Cardiology

## 2014-11-20 DIAGNOSIS — R072 Precordial pain: Secondary | ICD-10-CM | POA: Diagnosis not present

## 2014-11-20 DIAGNOSIS — I10 Essential (primary) hypertension: Secondary | ICD-10-CM | POA: Insufficient documentation

## 2014-11-20 LAB — EXERCISE TOLERANCE TEST
CHL CUP MPHR: 169 {beats}/min
CSEPED: 4 min
CSEPEDS: 11 s
CSEPEW: 6 METS
CSEPPHR: 148 {beats}/min
Percent HR: 87 %
RPE: 21312
Rest HR: 82 {beats}/min

## 2014-12-02 ENCOUNTER — Encounter: Payer: Self-pay | Admitting: Cardiovascular Disease

## 2014-12-02 ENCOUNTER — Ambulatory Visit (INDEPENDENT_AMBULATORY_CARE_PROVIDER_SITE_OTHER): Payer: Medicaid Other | Admitting: Cardiovascular Disease

## 2014-12-02 VITALS — BP 132/87 | HR 102 | Ht 64.0 in | Wt 269.6 lb

## 2014-12-02 DIAGNOSIS — I10 Essential (primary) hypertension: Secondary | ICD-10-CM

## 2014-12-02 DIAGNOSIS — G4733 Obstructive sleep apnea (adult) (pediatric): Secondary | ICD-10-CM

## 2014-12-02 DIAGNOSIS — Z9989 Dependence on other enabling machines and devices: Principal | ICD-10-CM

## 2014-12-02 NOTE — Patient Instructions (Signed)
[facility] will be making adjustments to your machine. Your physician recommends that you schedule a follow-up appointment as needed with Dr. Tresa EndoKelly for sleep.

## 2014-12-04 ENCOUNTER — Encounter: Payer: Self-pay | Admitting: Cardiovascular Disease

## 2014-12-04 DIAGNOSIS — G4733 Obstructive sleep apnea (adult) (pediatric): Secondary | ICD-10-CM | POA: Insufficient documentation

## 2014-12-04 DIAGNOSIS — I10 Essential (primary) hypertension: Secondary | ICD-10-CM | POA: Insufficient documentation

## 2014-12-04 DIAGNOSIS — Z9989 Dependence on other enabling machines and devices: Principal | ICD-10-CM

## 2014-12-04 NOTE — Progress Notes (Signed)
Patient ID: Diane Kline, female   DOB: May 27, 1961, 54 y.o.   MRN: [id number]     HPI: Diane Kline, is a 54 y.o. female who is followed by Dr. Antoine Kline and was referred for a sleep study to evaluate for potential sleep apnea.  She now presents for sleep clinic evaluation following initiation of CPAP therapy.  Diane Kline is a 54 year old female who has a history of morbid obesity, hypertension, fatigability, and palpitations.  She has a history of significant snoring, nonrestorative sleep and daytime sleepiness.  She was referred for diagnostic polysomnogram which was done at [facility] on 08/14/2014.  This revealed severe obstructive sleep apnea with an HIF 39.5 per hour.  Her sleep apnea was very severe in REM sleep with an AHI of 74 per hour, as well as in the supine position.  She underwent a CPAP titration trial, which was done on 08/29/2014.  She was titrated up to 9 cm water pressure.  At 9 cm, her AHI was 0 and she was able to achieve supine in rem sleep.  There was resolution of prior snoring.  Since initiating CPAP therapy.  She admits that she feels much better.  She notes marked improvement in her palpitations.  Her anxiety during sleep is significantly less.  She also feels she has improved cognitive function and greater ability to concentrate.  A download was obtained from 10/29/2014 through 11/27/2014.  She is meeting Medicare compliance standards with 28 of 30 days with device use.  The 2 days that she did not use the machine.  She had fallen asleep on the sofa.  She is using a 90% of the time greater than 4 hours duration.  She is averaging almost 7 hours of sleep per night.  At 97 m water pressure, her AHI was markedly improved at 1.7.  An apnea index was 0.3 and hypopnea index 1.4.  An Epworth Sleepiness Scale score today was calculated and still endorsed elevated at 13 and shown below:  Epworth Sleepiness Scale: Situation   Chance of Dozing/Sleeping (0 = never ,  1 = slight chance , 2 = moderate chance , 3 = high chance )   sitting and reading 3   watching TV 3   sitting inactive in a public place 1   being a passenger in a motor vehicle for an hour or more 1   lying down in the afternoon 3   sitting and talking to someone 0   sitting quietly after lunch (no alcohol) 2   while stopped for a few minutes in traffic as the driver 0   Total Score  13    Past Medical History  Diagnosis Date  . Sleep disorder     She thinks she has apnea but no testing  . Palpitations     Past Surgical History  Procedure Laterality Date  . Tubal ligation  2001  . Cesarean section    . Partial hysterectomy      Allergies  Allergen Reactions  . Shellfish Allergy     No current outpatient prescriptions on file.   No current facility-administered medications for this visit.    History   Social History  . Marital Status: Divorced    Spouse Name: N/A  . Number of Children: 1  . Years of Education: N/A   Occupational History  . Not on file.   Social History Main Topics  . Smoking status: Never Smoker   . Smokeless tobacco: Not on file  .  Alcohol Use: 0.0 oz/week    0 Standard drinks or equivalent per week  . Drug Use: No  . Sexual Activity: Not on file   Other Topics Concern  . Not on file   Social History Narrative   Lives with son.      Family History  Problem Relation Age of Onset  . Heart attack Father [id number]  . Hypertension Mother   . Heart attack Brother 55     ROS General: Negative; No fevers, chills, or night sweats HEENT: Negative; No changes in vision or hearing, sinus congestion, difficulty swallowing Pulmonary: Negative; No cough, wheezing, shortness of breath, hemoptysis Cardiovascular: Positive for occasional palpitations which have improved GI: Negative; No nausea, vomiting, diarrhea, or abdominal pain GU: Negative; No dysuria, hematuria, or difficulty voiding Musculoskeletal: Negative; no myalgias, joint pain, or  weakness Hematologic: Negative; no easy bruising, bleeding Endocrine: Negative; no heat/cold intolerance Neuro: Negative; no changes in balance, headaches Skin: Negative; No rashes or skin lesions Psychiatric: Negative; No behavioral problems, depression Sleep: See history of present illness;  No bruxism, restless legs, hypnogognic hallucinations, no cataplexy   Physical Exam BP 132/87 mmHg  Pulse 102  Ht 5\' 4"  (1.626 m)  Wt 269 lb 9.6 oz (122.29 kg)  BMI 46.25 kg/m2  Wt Readings from Last 3 Encounters:  12/02/14 269 lb 9.6 oz (122.29 kg)  10/18/14 267 lb (121.11 kg)  08/30/14 266 lb 3.2 oz (120.748 kg)   General: Alert, oriented, no distress.  Morbidly obese Skin: normal turgor, no rashes HEENT: Normocephalic, atraumatic. Pupils round and reactive; sclera anicteric; extraocular muscles intact; Fundi without hemorrhages or exudates Nose without nasal septal hypertrophy Mouth/Parynx benign; Mallinpatti scale 4 Neck: No JVD, no carotid briuts Lungs: clear to ausculatation and percussion; no wheezing or rales  Chest wall: No tenderness to palpation Heart: RRR, s1 s2 normal; very faint 1/6 systolic murmur; no diastolic murmur;  No rubs thrills or heaves. Abdomen: Central adiposity; soft, nontender; no hepatosplenomehaly, BS+; abdominal aorta nontender and not dilated by palpation. Back: No CVA tenderness Pulses 2+ Extremities: no clubbinbg cyanosis or edema, Homan's sign negative  Neurologic: grossly nonfocal; cranial nerves intact. Psychological: Normal affect and mood.   LABS:  BMP Latest Ref Rng 05/08/2009  Glucose 70 - 99 mg/dL 161(W107(H)  BUN 6 - 23 mg/dL 10  Creatinine 0.4 - 1.2 mg/dL 9.600.66  Sodium 454135 - 098145 mEq/L 137  Potassium 3.5 - 5.1 mEq/L 3.9  Chloride 96 - 112 mEq/L 107  CO2 19 - 32 mEq/L 25  Calcium 8.4 - 10.5 mg/dL 9.1     No flowsheet data found.   CBC Latest Ref Rng 05/08/2009  WBC 4.0 - 10.5 K/uL 6.9  Hemoglobin 12.0 - 15.0 g/dL 11.913.1  Hematocrit 14.736.0  - 46.0 % 37.4  Platelets 150 - 400 K/uL 360     Lipid Panel  No results found for: CHOL, TRIG, HDL, CHOLHDL, VLDL, LDLCALC, LDLDIRECT   RADIOLOGY: No results found.    ASSESSMENT AND PLAN: Diane Kline is a 54 year old female was a history of morbid obesity, hypertension, and previously admitted to significant snoring, nonrestorative sleep, and daytime sleepiness. She has been documented to have severe sleep apnea with an AHI of 39.5 per hour which became significantly worse both with supine posture as well as during REM sleep.  Subjectively, she feels remarkably improved.  She believes her palpitations are less, her concentration is enhanced.  Her anxiety level is reduced, particularly at sleep and she feels she has improved  cognitive function.  I am recommending slight additional titration of her CPAP to 10 cm water pressure.  She is using a ResMed air-filled F 10 full face mask and seems to tolerate this well with only one day where the leak was felt to be significant.  I discussed normal versus abnormal sleep architecture, and adverse cardiovascular consequences associated with untreated sleep apnea.  She will continue with current therapy.  A subsequent download will be obtained.  I will  see her in one year for follow-up sleep evaluation.   Time spent: 25 minutes  Lennette Bihari, MD, [facility]  12/04/2014 6:33 PM

## 2015-02-20 ENCOUNTER — Telehealth: Payer: Self-pay | Admitting: *Deleted

## 2015-02-20 NOTE — Telephone Encounter (Signed)
Faxed signed order for CPAP machine to [facility].

## 2015-06-09 ENCOUNTER — Telehealth: Payer: Self-pay | Admitting: Cardiovascular Disease

## 2015-06-09 NOTE — Telephone Encounter (Signed)
New Message  Pt calling to speak w/ RN about possibly new mask for CPAP machine. Please call back and discus.s

## 2015-06-09 NOTE — Telephone Encounter (Signed)
Pt recommended to contact DME vendor. She verb'd understanding.

## 2017-02-22 ENCOUNTER — Other Ambulatory Visit (HOSPITAL_COMMUNITY): Payer: Self-pay | Admitting: Family Medicine

## 2017-02-22 ENCOUNTER — Ambulatory Visit (HOSPITAL_COMMUNITY)
Admission: RE | Admit: 2017-02-22 | Discharge: 2017-02-22 | Disposition: A | Discharge status: Home / Self Care | Payer: Medicaid Other | Source: Ambulatory Visit | Attending: Family Medicine | Admitting: Family Medicine

## 2017-02-22 DIAGNOSIS — R52 Pain, unspecified: Secondary | ICD-10-CM

## 2017-02-22 DIAGNOSIS — M79645 Pain in left finger(s): Secondary | ICD-10-CM | POA: Insufficient documentation

## 2018-09-12 IMAGING — DX DG HAND COMPLETE 3+V*L*
3 series · 3 of 3 positions shown · non-contrast
Comparison: None.

CLINICAL DATA: Left index finger pain for several weeks. No known
injury.

EXAM:
LEFT HAND - COMPLETE 3+ VIEW

[hand pa]
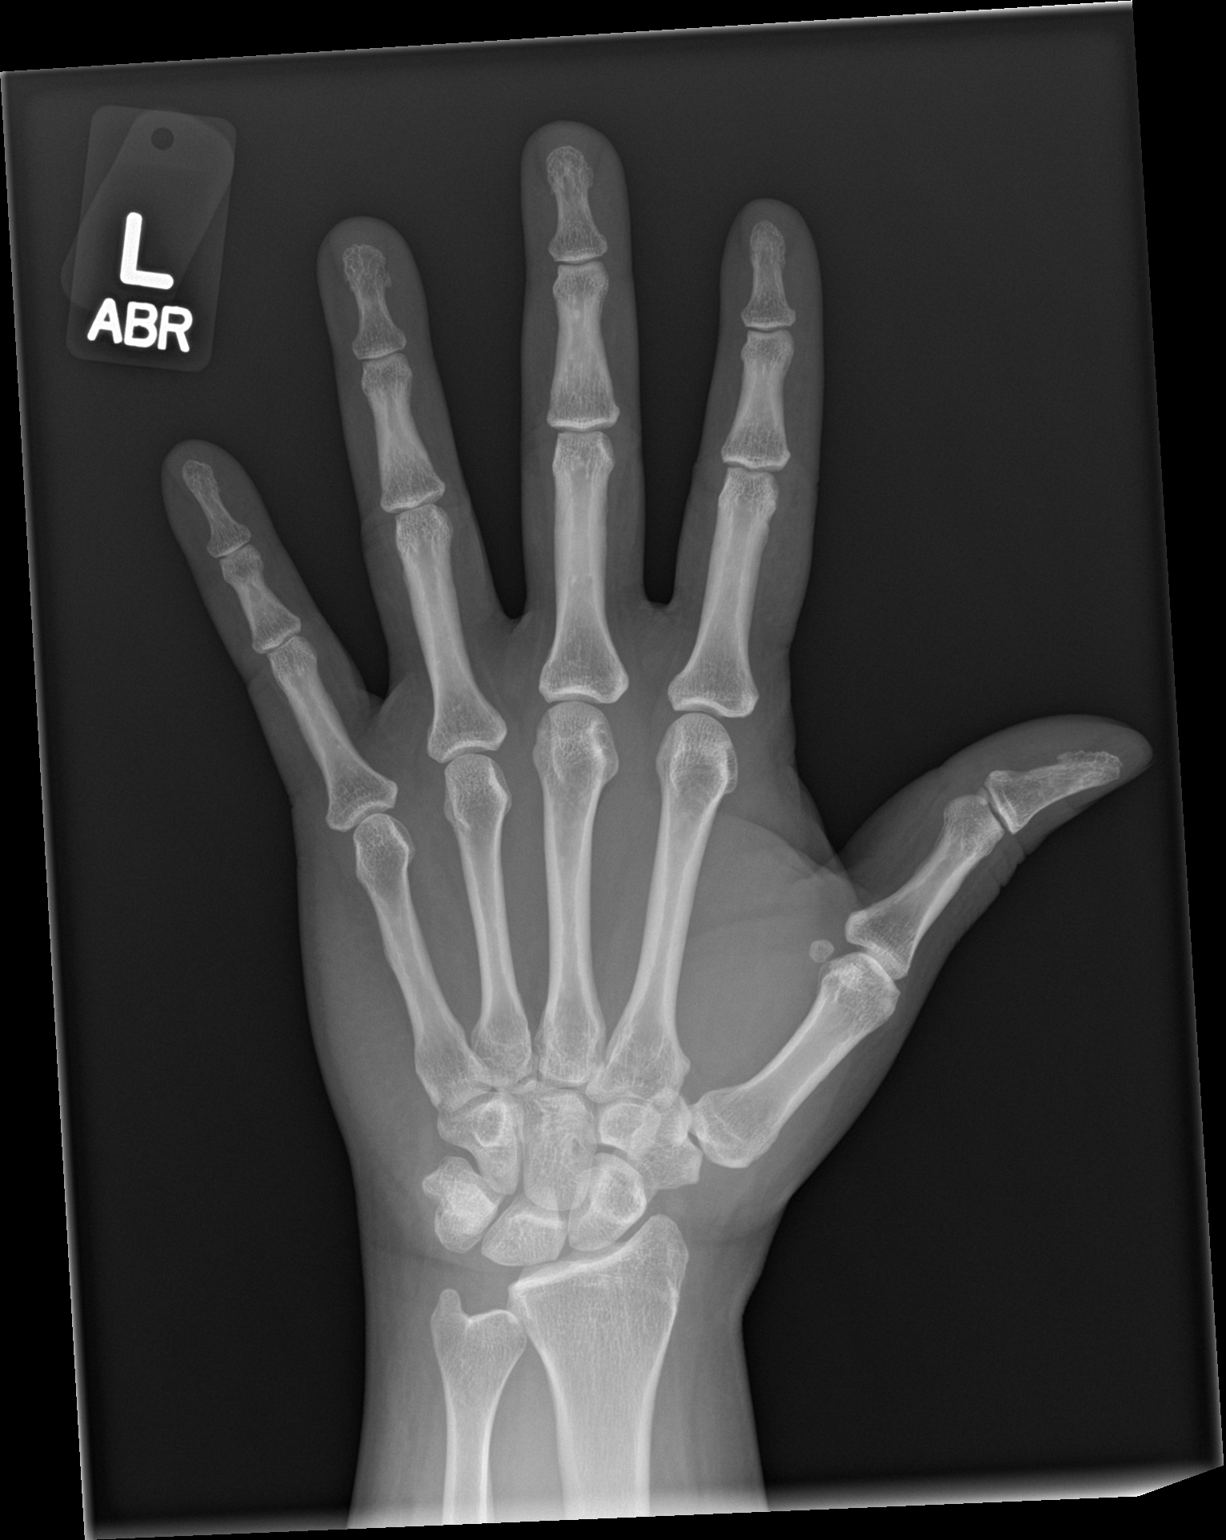

[hand obl]
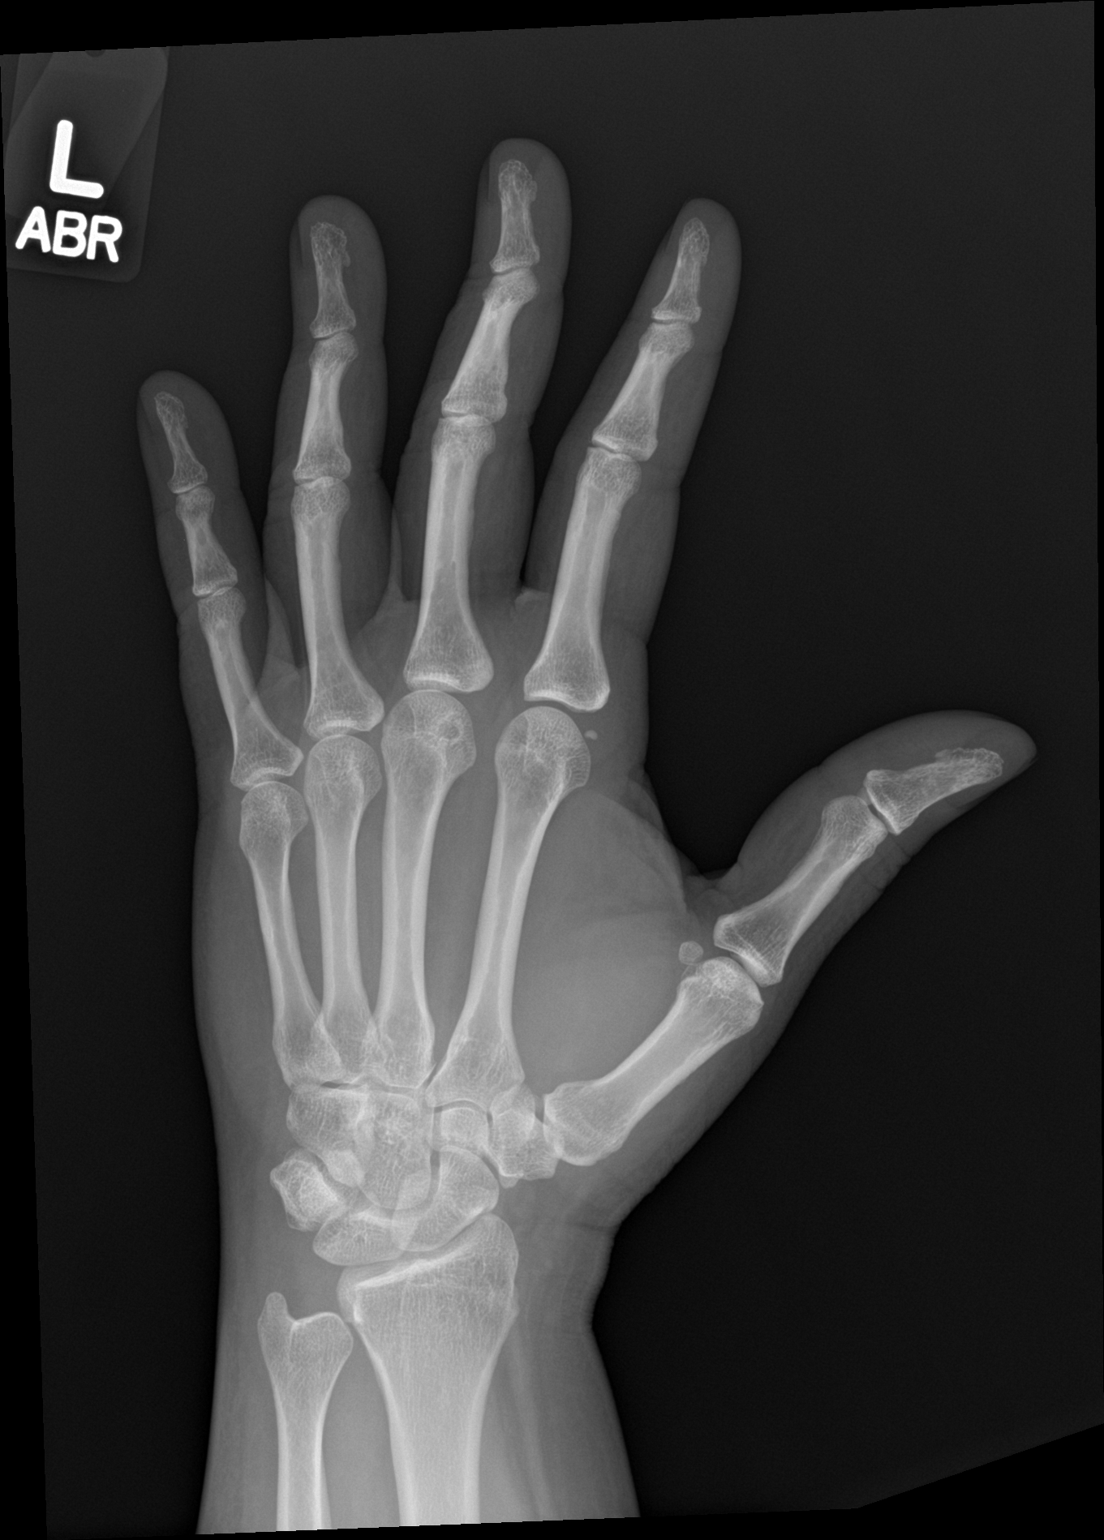

[hand lat]
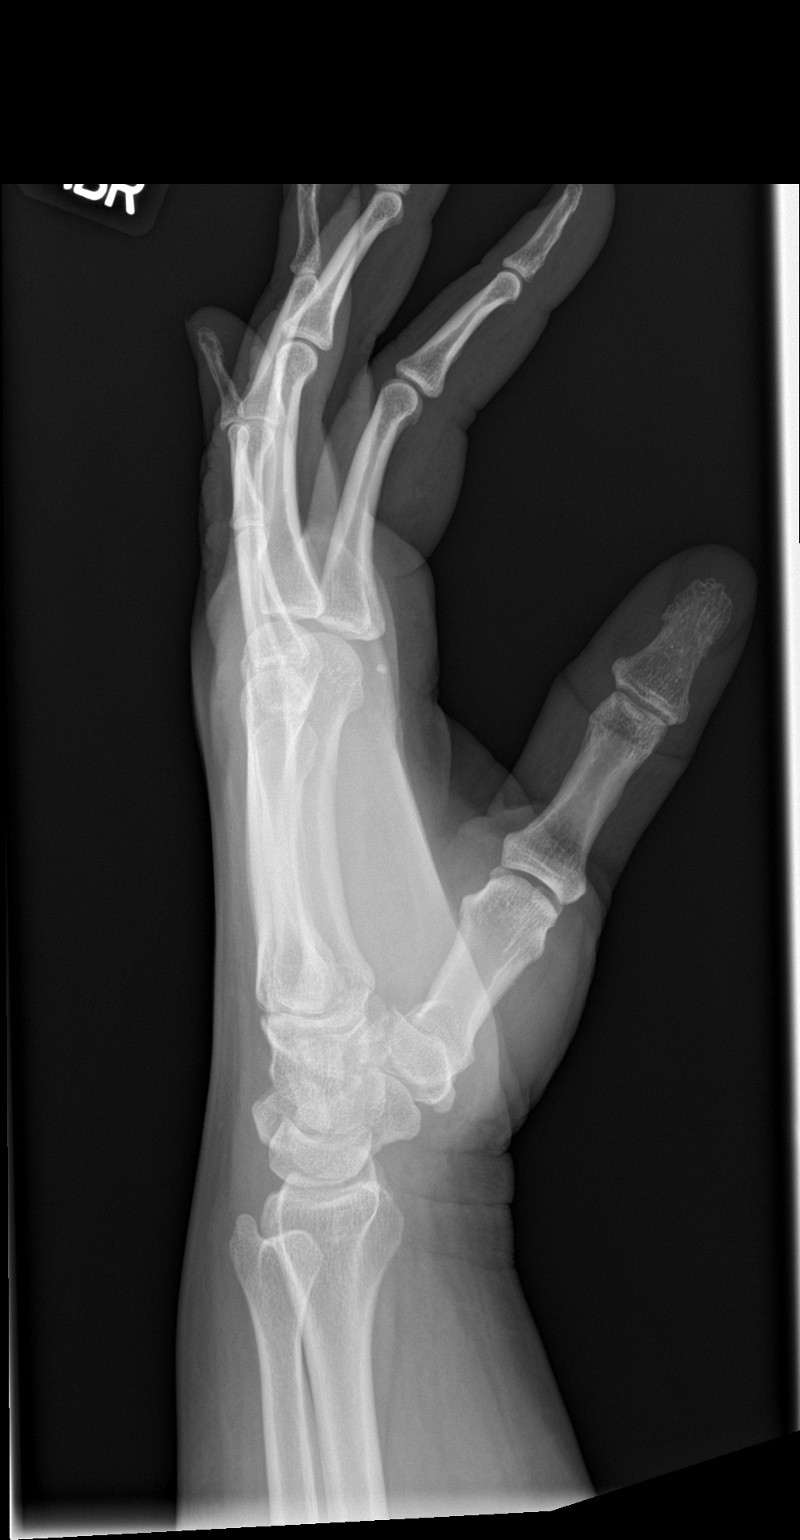

[3 of 3 positions shown; findings below may reference images not displayed]

FINDINGS: There is no evidence of fracture or dislocation. There is no
evidence of arthropathy or other focal bone abnormality. Ulnar minus
variance. Soft tissues are unremarkable.
IMPRESSION: Negative.
